# Patient Record
Sex: Female | Born: 1985 | Race: White | Hispanic: No | Marital: Married | Smoking: Never smoker
Health system: Southern US, Community
[De-identification: ages and names within clinical notes are randomized; demographics above are authoritative.]

## PROBLEM LIST (undated history)

## (undated) DIAGNOSIS — L089 Local infection of the skin and subcutaneous tissue, unspecified: Secondary | ICD-10-CM

## (undated) DIAGNOSIS — L709 Acne, unspecified: Secondary | ICD-10-CM

## (undated) HISTORY — PX: NO PAST SURGERIES: SHX2092

---

## 2016-05-14 ENCOUNTER — Other Ambulatory Visit: Payer: Self-pay | Admitting: Orthopedic Surgery

## 2016-05-17 ENCOUNTER — Encounter (HOSPITAL_COMMUNITY): Payer: Self-pay | Admitting: *Deleted

## 2016-05-17 NOTE — Progress Notes (Addendum)
Patient wears a Hijab and requested that she be allowed to wear it. I told patient we would let her keep it on as long as possible. I informed her that when she goes back to the OR we would give her a head cover and advised her that if that was not enough we could also provide a sheet that she could cover up further with but that we would be as accommodating as possible while keeping her safe and preventing infection. Patient reports having a skin rash on her face requiring ampicillin. I requested patient contact Dr. Marshell Levanumonski's office and let them know this tomorrow. Patient verbalized understanding.

## 2016-05-17 NOTE — H&P (Signed)
     PREOPERATIVE H&P  Chief Complaint: Left leg pain  HPI: Courtney Boyd is a 30 y.o. female who presents with ongoing pain in the left leg x 6 months  MRI reveals a left L5/S1 HNP with a grade 1 slip, with compression of the left S1 nerve noted  Patient has failed multiple forms of conservative care and continues to have pain (see office notes for additional details regarding the patient's full course of treatment)  No past medical history on file. No past surgical history on file. Social History   Social History  . Marital Status: N/A    Spouse Name: N/A  . Number of Children: N/A  . Years of Education: N/A   Social History Main Topics  . Smoking status: Not on file  . Smokeless tobacco: Not on file  . Alcohol Use: Not on file  . Drug Use: Not on file  . Sexual Activity: Not on file   Other Topics Concern  . Not on file   Social History Narrative  . No narrative on file   No family history on file. Allergies not on file Prior to Admission medications   Not on File     All other systems have been reviewed and were otherwise negative with the exception of those mentioned in the HPI and as above.  Physical Exam: There were no vitals filed for this visit.  General: Alert, no acute distress Cardiovascular: No pedal edema Respiratory: No cyanosis, no use of accessory musculature Skin: No lesions in the area of chief complaint Neurologic: Sensation intact distally Psychiatric: Patient is competent for consent with normal mood and affect Lymphatic: No axillary or cervical lymphadenopathy  MUSCULOSKELETAL: + SLR on the left  Assessment/Plan: Left leg pain  Plan for Procedure(s): LEFT SIDED LUMBAR 5-SACRUM 1 TRANSFORAMINAL LUMBAR INTERBODY FUSION WITH INSTRMENTATION AND ALLOGRAFT   Emilee HeroUMONSKI,Sloane Junkin LEONARD, MD 05/17/2016 8:18 AM

## 2016-05-19 ENCOUNTER — Inpatient Hospital Stay (HOSPITAL_COMMUNITY): Payer: PPO | Admitting: Anesthesiology

## 2016-05-19 ENCOUNTER — Encounter (HOSPITAL_COMMUNITY): Payer: Self-pay | Admitting: *Deleted

## 2016-05-19 ENCOUNTER — Inpatient Hospital Stay (HOSPITAL_COMMUNITY): Payer: PPO

## 2016-05-19 ENCOUNTER — Encounter (HOSPITAL_COMMUNITY)
Admission: RE | Disposition: A | Discharge status: Home / Self Care | Payer: Self-pay | Source: Ambulatory Visit | Attending: Orthopedic Surgery

## 2016-05-19 ENCOUNTER — Inpatient Hospital Stay (HOSPITAL_COMMUNITY)
Admission: RE | Admit: 2016-05-19 | Discharge: 2016-05-22 | DRG: 460 | Disposition: A | Discharge status: Home / Self Care | Payer: PPO | Source: Ambulatory Visit | Attending: Orthopedic Surgery | Admitting: Orthopedic Surgery

## 2016-05-19 DIAGNOSIS — M5417 Radiculopathy, lumbosacral region: Secondary | ICD-10-CM | POA: Diagnosis present

## 2016-05-19 DIAGNOSIS — Z419 Encounter for procedure for purposes other than remedying health state, unspecified: Secondary | ICD-10-CM

## 2016-05-19 DIAGNOSIS — M5127 Other intervertebral disc displacement, lumbosacral region: Secondary | ICD-10-CM | POA: Diagnosis present

## 2016-05-19 DIAGNOSIS — M4317 Spondylolisthesis, lumbosacral region: Principal | ICD-10-CM | POA: Diagnosis present

## 2016-05-19 DIAGNOSIS — Z01818 Encounter for other preprocedural examination: Secondary | ICD-10-CM

## 2016-05-19 DIAGNOSIS — M5410 Radiculopathy, site unspecified: Secondary | ICD-10-CM | POA: Diagnosis present

## 2016-05-19 HISTORY — DX: Acne, unspecified: L70.9

## 2016-05-19 HISTORY — DX: Local infection of the skin and subcutaneous tissue, unspecified: L08.9

## 2016-05-19 LAB — COMPREHENSIVE METABOLIC PANEL
ALT: 11 U/L — ABNORMAL LOW (ref 14–54)
ANION GAP: 6 (ref 5–15)
AST: 20 U/L (ref 15–41)
Albumin: 3.9 g/dL (ref 3.5–5.0)
Alkaline Phosphatase: 28 U/L — ABNORMAL LOW (ref 38–126)
CHLORIDE: 110 mmol/L (ref 101–111)
CO2: 23 mmol/L (ref 22–32)
Calcium: 9 mg/dL (ref 8.9–10.3)
Creatinine, Ser: 0.58 mg/dL (ref 0.44–1.00)
Glucose, Bld: 96 mg/dL (ref 65–99)
POTASSIUM: 3.7 mmol/L (ref 3.5–5.1)
Sodium: 139 mmol/L (ref 135–145)
Total Bilirubin: 0.9 mg/dL (ref 0.3–1.2)
Total Protein: 6.7 g/dL (ref 6.5–8.1)

## 2016-05-19 LAB — CBC WITH DIFFERENTIAL/PLATELET
Basophils Absolute: 0 10*3/uL (ref 0.0–0.1)
Basophils Relative: 0 %
Eosinophils Absolute: 0 10*3/uL (ref 0.0–0.7)
Eosinophils Relative: 0 %
HEMATOCRIT: 34.5 % — AB (ref 36.0–46.0)
Hemoglobin: 10.9 g/dL — ABNORMAL LOW (ref 12.0–15.0)
LYMPHS PCT: 54 %
Lymphs Abs: 1.7 10*3/uL (ref 0.7–4.0)
MCH: 22.6 pg — ABNORMAL LOW (ref 26.0–34.0)
MCHC: 31.6 g/dL (ref 30.0–36.0)
MCV: 71.6 fL — AB (ref 78.0–100.0)
MONO ABS: 0.2 10*3/uL (ref 0.1–1.0)
MONOS PCT: 7 %
NEUTROS ABS: 1.2 10*3/uL — AB (ref 1.7–7.7)
Neutrophils Relative %: 39 %
Platelets: 175 10*3/uL (ref 150–400)
RBC: 4.82 MIL/uL (ref 3.87–5.11)
RDW: 13.9 % (ref 11.5–15.5)
WBC: 3.1 10*3/uL — ABNORMAL LOW (ref 4.0–10.5)

## 2016-05-19 LAB — URINALYSIS, ROUTINE W REFLEX MICROSCOPIC
BILIRUBIN URINE: NEGATIVE
Glucose, UA: NEGATIVE mg/dL
Hgb urine dipstick: NEGATIVE
Ketones, ur: 15 mg/dL — AB
Leukocytes, UA: NEGATIVE
NITRITE: NEGATIVE
Protein, ur: NEGATIVE mg/dL
SPECIFIC GRAVITY, URINE: 1.023 (ref 1.005–1.030)
pH: 5 (ref 5.0–8.0)

## 2016-05-19 LAB — TYPE AND SCREEN
ABO/RH(D): O POS
Antibody Screen: NEGATIVE

## 2016-05-19 LAB — SURGICAL PCR SCREEN
MRSA, PCR: NEGATIVE
STAPHYLOCOCCUS AUREUS: POSITIVE — AB

## 2016-05-19 LAB — PROTIME-INR
INR: 1.17 (ref 0.00–1.49)
Prothrombin Time: 15.1 seconds (ref 11.6–15.2)

## 2016-05-19 LAB — HCG, SERUM, QUALITATIVE: Preg, Serum: NEGATIVE

## 2016-05-19 LAB — APTT: aPTT: 32 seconds (ref 24–37)

## 2016-05-19 LAB — ABO/RH: ABO/RH(D): O POS

## 2016-05-19 SURGERY — POSTERIOR LUMBAR FUSION 1 LEVEL
Anesthesia: General | Laterality: Left

## 2016-05-19 MED ORDER — BUPIVACAINE-EPINEPHRINE 0.25% -1:200000 IJ SOLN
INTRAMUSCULAR | Status: DC | PRN
Start: 2016-05-19 — End: 2016-05-19
  Administered 2016-05-19: 24 mL

## 2016-05-19 MED ORDER — PROPOFOL 1000 MG/100ML IV EMUL
INTRAVENOUS | Status: AC
  Filled 2016-05-19: qty 300

## 2016-05-19 MED ORDER — ONDANSETRON HCL 4 MG/2ML IJ SOLN: 4.0000 mg | Freq: Once | INTRAMUSCULAR | Status: DC | PRN

## 2016-05-19 MED ORDER — ACETAMINOPHEN 650 MG RE SUPP: 650.0000 mg | RECTAL | Status: DC | PRN

## 2016-05-19 MED ORDER — SUGAMMADEX SODIUM 200 MG/2ML IV SOLN
INTRAVENOUS | Status: AC
  Filled 2016-05-19: qty 2

## 2016-05-19 MED ORDER — DIAZEPAM 5 MG PO TABS
ORAL_TABLET | ORAL | Status: AC
  Filled 2016-05-19: qty 1

## 2016-05-19 MED ORDER — LACTATED RINGERS IV SOLN
INTRAVENOUS | Status: DC
  Administered 2016-05-19 (×2): via INTRAVENOUS

## 2016-05-19 MED ORDER — AMPICILLIN 500 MG PO CAPS
500.0000 mg | ORAL_CAPSULE | Freq: Two times a day (BID) | ORAL | Status: DC
  Administered 2016-05-19 – 2016-05-22 (×6): 500 mg via ORAL
  Filled 2016-05-19 (×6): qty 1

## 2016-05-19 MED ORDER — OXYCODONE-ACETAMINOPHEN 5-325 MG PO TABS
1.0000 | ORAL_TABLET | ORAL | Status: DC | PRN
  Administered 2016-05-20 – 2016-05-22 (×10): 2 via ORAL
  Filled 2016-05-19 (×10): qty 2

## 2016-05-19 MED ORDER — ZOLPIDEM TARTRATE 5 MG PO TABS: 5.0000 mg | ORAL_TABLET | Freq: Every evening | ORAL | Status: DC | PRN

## 2016-05-19 MED ORDER — ONDANSETRON HCL 4 MG/2ML IJ SOLN
INTRAMUSCULAR | Status: AC
  Filled 2016-05-19: qty 2

## 2016-05-19 MED ORDER — ALUM & MAG HYDROXIDE-SIMETH 200-200-20 MG/5ML PO SUSP: 30.0000 mL | Freq: Four times a day (QID) | ORAL | Status: DC | PRN

## 2016-05-19 MED ORDER — METHYLENE BLUE 0.5 % INJ SOLN
INTRAVENOUS | Status: AC
  Filled 2016-05-19: qty 10

## 2016-05-19 MED ORDER — OXYCODONE HCL 5 MG PO TABS
ORAL_TABLET | ORAL | Status: AC
  Filled 2016-05-19: qty 1

## 2016-05-19 MED ORDER — FENTANYL CITRATE (PF) 250 MCG/5ML IJ SOLN
INTRAMUSCULAR | Status: AC
  Filled 2016-05-19: qty 5

## 2016-05-19 MED ORDER — FLEET ENEMA 7-19 GM/118ML RE ENEM: 1.0000 | ENEMA | Freq: Once | RECTAL | Status: DC | PRN

## 2016-05-19 MED ORDER — CEFAZOLIN SODIUM-DEXTROSE 2-4 GM/100ML-% IV SOLN
2.0000 g | INTRAVENOUS | Status: AC
  Administered 2016-05-19: 2 g via INTRAVENOUS
  Filled 2016-05-19: qty 100

## 2016-05-19 MED ORDER — BISACODYL 5 MG PO TBEC: 5.0000 mg | DELAYED_RELEASE_TABLET | Freq: Every day | ORAL | Status: DC | PRN

## 2016-05-19 MED ORDER — FENTANYL CITRATE (PF) 100 MCG/2ML IJ SOLN
INTRAMUSCULAR | Status: DC | PRN
  Administered 2016-05-19: 50 ug via INTRAVENOUS
  Administered 2016-05-19 (×2): 100 ug via INTRAVENOUS
  Administered 2016-05-19: 50 ug via INTRAVENOUS
  Administered 2016-05-19: 100 ug via INTRAVENOUS
  Administered 2016-05-19 (×2): 50 ug via INTRAVENOUS

## 2016-05-19 MED ORDER — MIDAZOLAM HCL 2 MG/2ML IJ SOLN
INTRAMUSCULAR | Status: AC
  Filled 2016-05-19: qty 2

## 2016-05-19 MED ORDER — SODIUM CHLORIDE 0.9% FLUSH: 3.0000 mL | INTRAVENOUS | Status: DC | PRN

## 2016-05-19 MED ORDER — POVIDONE-IODINE 7.5 % EX SOLN
Freq: Once | CUTANEOUS | Status: DC
  Filled 2016-05-19: qty 118

## 2016-05-19 MED ORDER — PROPOFOL 10 MG/ML IV BOLUS
INTRAVENOUS | Status: AC
  Filled 2016-05-19: qty 20

## 2016-05-19 MED ORDER — PHENOL 1.4 % MT LIQD: 1.0000 | OROMUCOSAL | Status: DC | PRN

## 2016-05-19 MED ORDER — OXYCODONE HCL 5 MG PO TABS
5.0000 mg | ORAL_TABLET | Freq: Once | ORAL | Status: AC | PRN
Start: 2016-05-19 — End: 2016-05-19
  Administered 2016-05-19: 5 mg via ORAL

## 2016-05-19 MED ORDER — SODIUM CHLORIDE 0.9 % IV SOLN
INTRAVENOUS | Status: DC
  Administered 2016-05-19: 20:00:00 via INTRAVENOUS

## 2016-05-19 MED ORDER — HYDROCODONE-ACETAMINOPHEN 5-325 MG PO TABS: 1.0000 | ORAL_TABLET | ORAL | Status: DC | PRN

## 2016-05-19 MED ORDER — SUCCINYLCHOLINE CHLORIDE 20 MG/ML IJ SOLN
INTRAMUSCULAR | Status: DC | PRN
  Administered 2016-05-19: 60 mg via INTRAVENOUS

## 2016-05-19 MED ORDER — HEMOSTATIC AGENTS (NO CHARGE) OPTIME
TOPICAL | Status: DC | PRN
  Administered 2016-05-19: 1 via TOPICAL

## 2016-05-19 MED ORDER — SODIUM CHLORIDE 0.9% FLUSH
3.0000 mL | Freq: Two times a day (BID) | INTRAVENOUS | Status: DC
  Administered 2016-05-19 – 2016-05-21 (×5): 3 mL via INTRAVENOUS

## 2016-05-19 MED ORDER — OXYCODONE HCL 5 MG/5ML PO SOLN: 5.0000 mg | Freq: Once | ORAL | Status: AC | PRN

## 2016-05-19 MED ORDER — PROPOFOL 500 MG/50ML IV EMUL
INTRAVENOUS | Status: DC | PRN
  Administered 2016-05-19: 150 ug/kg/min via INTRAVENOUS
  Administered 2016-05-19: 15:00:00 via INTRAVENOUS

## 2016-05-19 MED ORDER — ONDANSETRON HCL 4 MG/2ML IJ SOLN
INTRAMUSCULAR | Status: DC | PRN
  Administered 2016-05-19 (×2): 4 mg via INTRAVENOUS

## 2016-05-19 MED ORDER — ONDANSETRON HCL 4 MG/2ML IJ SOLN
4.0000 mg | INTRAMUSCULAR | Status: DC | PRN
Start: 2016-05-19 — End: 2016-05-22

## 2016-05-19 MED ORDER — MUPIROCIN 2 % EX OINT
1.0000 "application " | TOPICAL_OINTMENT | Freq: Once | CUTANEOUS | Status: AC
  Administered 2016-05-19: 1 via TOPICAL
  Filled 2016-05-19: qty 22

## 2016-05-19 MED ORDER — CEFAZOLIN SODIUM 1-5 GM-% IV SOLN
1.0000 g | Freq: Three times a day (TID) | INTRAVENOUS | Status: AC
  Administered 2016-05-19 – 2016-05-20 (×2): 1 g via INTRAVENOUS
  Filled 2016-05-19 (×2): qty 50

## 2016-05-19 MED ORDER — DIAZEPAM 5 MG PO TABS
5.0000 mg | ORAL_TABLET | Freq: Four times a day (QID) | ORAL | Status: DC | PRN
  Administered 2016-05-19 – 2016-05-21 (×4): 5 mg via ORAL
  Filled 2016-05-19 (×3): qty 1

## 2016-05-19 MED ORDER — MIDAZOLAM HCL 5 MG/5ML IJ SOLN
INTRAMUSCULAR | Status: DC | PRN
  Administered 2016-05-19: 2 mg via INTRAVENOUS

## 2016-05-19 MED ORDER — ROCURONIUM BROMIDE 100 MG/10ML IV SOLN
INTRAVENOUS | Status: DC | PRN
  Administered 2016-05-19: 10 mg via INTRAVENOUS
  Administered 2016-05-19: 30 mg via INTRAVENOUS

## 2016-05-19 MED ORDER — 0.9 % SODIUM CHLORIDE (POUR BTL) OPTIME
TOPICAL | Status: DC | PRN
Start: 2016-05-19 — End: 2016-05-19
  Administered 2016-05-19: 1000 mL

## 2016-05-19 MED ORDER — LIDOCAINE 2% (20 MG/ML) 5 ML SYRINGE
INTRAMUSCULAR | Status: AC
  Filled 2016-05-19: qty 5

## 2016-05-19 MED ORDER — LIDOCAINE HCL (CARDIAC) 20 MG/ML IV SOLN
INTRAVENOUS | Status: DC | PRN
  Administered 2016-05-19: 60 mg via INTRAVENOUS

## 2016-05-19 MED ORDER — THROMBIN 20000 UNITS EX KIT
PACK | CUTANEOUS | Status: DC | PRN
  Administered 2016-05-19: 20000 [IU] via TOPICAL

## 2016-05-19 MED ORDER — LACTATED RINGERS IV SOLN
INTRAVENOUS | Status: DC | PRN
  Administered 2016-05-19: 14:00:00 via INTRAVENOUS

## 2016-05-19 MED ORDER — SODIUM CHLORIDE 0.9 % IV SOLN: 250.0000 mL | INTRAVENOUS | Status: DC

## 2016-05-19 MED ORDER — HYDROMORPHONE HCL 1 MG/ML IJ SOLN
INTRAMUSCULAR | Status: AC
  Filled 2016-05-19: qty 1

## 2016-05-19 MED ORDER — ACETAMINOPHEN 325 MG PO TABS
650.0000 mg | ORAL_TABLET | ORAL | Status: DC | PRN
  Administered 2016-05-21: 650 mg via ORAL
  Filled 2016-05-19: qty 2

## 2016-05-19 MED ORDER — SODIUM CHLORIDE 0.9 % IV SOLN
0.0125 ug/kg/min | INTRAVENOUS | Status: AC
  Administered 2016-05-19: .05 ug/kg/min via INTRAVENOUS
  Filled 2016-05-19: qty 2000

## 2016-05-19 MED ORDER — PROCHLORPERAZINE EDISYLATE 5 MG/ML IJ SOLN: 5.0000 mg | Freq: Once | INTRAMUSCULAR | Status: DC | PRN

## 2016-05-19 MED ORDER — DOCUSATE SODIUM 100 MG PO CAPS
100.0000 mg | ORAL_CAPSULE | Freq: Two times a day (BID) | ORAL | Status: DC
Start: 2016-05-19 — End: 2016-05-22
  Administered 2016-05-19 – 2016-05-22 (×6): 100 mg via ORAL
  Filled 2016-05-19 (×6): qty 1

## 2016-05-19 MED ORDER — SUCCINYLCHOLINE CHLORIDE 200 MG/10ML IV SOSY
PREFILLED_SYRINGE | INTRAVENOUS | Status: AC
  Filled 2016-05-19: qty 10

## 2016-05-19 MED ORDER — THROMBIN 20000 UNITS EX SOLR
CUTANEOUS | Status: AC
  Filled 2016-05-19: qty 20000

## 2016-05-19 MED ORDER — MORPHINE SULFATE (PF) 2 MG/ML IV SOLN
1.0000 mg | INTRAVENOUS | Status: DC | PRN
  Administered 2016-05-19: 2 mg via INTRAVENOUS
  Administered 2016-05-20: 4 mg via INTRAVENOUS
  Administered 2016-05-20 – 2016-05-21 (×3): 2 mg via INTRAVENOUS
  Administered 2016-05-22: 4 mg via INTRAVENOUS
  Filled 2016-05-19 (×4): qty 1
  Filled 2016-05-19 (×2): qty 2

## 2016-05-19 MED ORDER — MENTHOL 3 MG MT LOZG
1.0000 | LOZENGE | OROMUCOSAL | Status: DC | PRN
Start: 2016-05-19 — End: 2016-05-22

## 2016-05-19 MED ORDER — ALBUMIN HUMAN 5 % IV SOLN
INTRAVENOUS | Status: DC | PRN
  Administered 2016-05-19: 16:00:00 via INTRAVENOUS

## 2016-05-19 MED ORDER — PROPOFOL 10 MG/ML IV BOLUS
INTRAVENOUS | Status: DC | PRN
  Administered 2016-05-19: 200 mg via INTRAVENOUS
  Administered 2016-05-19: 40 mg via INTRAVENOUS

## 2016-05-19 MED ORDER — HYDROMORPHONE HCL 1 MG/ML IJ SOLN
0.2500 mg | INTRAMUSCULAR | Status: DC | PRN
  Administered 2016-05-19 (×2): 0.5 mg via INTRAVENOUS

## 2016-05-19 MED ORDER — SENNOSIDES-DOCUSATE SODIUM 8.6-50 MG PO TABS: 1.0000 | ORAL_TABLET | Freq: Every evening | ORAL | Status: DC | PRN

## 2016-05-19 MED ORDER — SUGAMMADEX SODIUM 200 MG/2ML IV SOLN
INTRAVENOUS | Status: DC | PRN
  Administered 2016-05-19: 120 mg via INTRAVENOUS

## 2016-05-19 SURGICAL SUPPLY — 90 items
BENZOIN TINCTURE PRP APPL 2/3 (GAUZE/BANDAGES/DRESSINGS) ×3 IMPLANT
BLADE SURG ROTATE 9660 (MISCELLANEOUS) IMPLANT
BUR PRESCISION 1.7 ELITE (BURR) ×3 IMPLANT
BUR ROUND PRECISION 4.0 (BURR) ×2 IMPLANT
BUR ROUND PRECISION 4.0MM (BURR) ×1
BUR SABER RD CUTTING 3.0 (BURR) IMPLANT
BUR SABER RD CUTTING 3.0MM (BURR)
CAGE CONCORDE BULLET 9X11X23 (Cage) ×1 IMPLANT
CAGE CONCORDE BULLET 9X11X23MM (Cage) ×1 IMPLANT
CAGE SPNL PRLL BLT NOSE 23X9 (Cage) ×1 IMPLANT
CARTRIDGE OIL MAESTRO DRILL (MISCELLANEOUS) ×1 IMPLANT
CLOSURE STERI-STRIP 1/2X4 (GAUZE/BANDAGES/DRESSINGS) ×1
CLOSURE WOUND 1/2 X4 (GAUZE/BANDAGES/DRESSINGS) ×1
CLSR STERI-STRIP ANTIMIC 1/2X4 (GAUZE/BANDAGES/DRESSINGS) ×2 IMPLANT
CONT SPEC STER OR (MISCELLANEOUS) IMPLANT
COVER MAYO STAND STRL (DRAPES) ×6 IMPLANT
COVER SURGICAL LIGHT HANDLE (MISCELLANEOUS) ×3 IMPLANT
DIFFUSER DRILL AIR PNEUMATIC (MISCELLANEOUS) ×3 IMPLANT
DRAIN CHANNEL 15F RND FF W/TCR (WOUND CARE) IMPLANT
DRAPE C-ARM 42X72 X-RAY (DRAPES) ×3 IMPLANT
DRAPE C-ARMOR (DRAPES) ×3 IMPLANT
DRAPE POUCH INSTRU U-SHP 10X18 (DRAPES) ×3 IMPLANT
DRAPE SURG 17X23 STRL (DRAPES) ×12 IMPLANT
DURAPREP 26ML APPLICATOR (WOUND CARE) ×3 IMPLANT
ELECT BLADE 4.0 EZ CLEAN MEGAD (MISCELLANEOUS) ×3
ELECT CAUTERY BLADE 6.4 (BLADE) ×3 IMPLANT
ELECT REM PT RETURN 9FT ADLT (ELECTROSURGICAL) ×3
ELECTRODE BLDE 4.0 EZ CLN MEGD (MISCELLANEOUS) ×1 IMPLANT
ELECTRODE REM PT RTRN 9FT ADLT (ELECTROSURGICAL) ×1 IMPLANT
EVACUATOR SILICONE 100CC (DRAIN) IMPLANT
FEE INTRAOP MONITOR IMPULS NCS (MISCELLANEOUS) ×1 IMPLANT
GAUZE SPONGE 4X4 12PLY STRL (GAUZE/BANDAGES/DRESSINGS) ×3 IMPLANT
GAUZE SPONGE 4X4 16PLY XRAY LF (GAUZE/BANDAGES/DRESSINGS) ×3 IMPLANT
GLOVE BIO SURGEON STRL SZ7 (GLOVE) ×3 IMPLANT
GLOVE BIO SURGEON STRL SZ8 (GLOVE) ×6 IMPLANT
GLOVE BIOGEL PI IND STRL 7.0 (GLOVE) ×1 IMPLANT
GLOVE BIOGEL PI IND STRL 8 (GLOVE) ×1 IMPLANT
GLOVE BIOGEL PI INDICATOR 7.0 (GLOVE) ×2
GLOVE BIOGEL PI INDICATOR 8 (GLOVE) ×2
GOWN STRL REUS W/ TWL LRG LVL3 (GOWN DISPOSABLE) ×2 IMPLANT
GOWN STRL REUS W/ TWL XL LVL3 (GOWN DISPOSABLE) ×1 IMPLANT
GOWN STRL REUS W/TWL LRG LVL3 (GOWN DISPOSABLE) ×4
GOWN STRL REUS W/TWL XL LVL3 (GOWN DISPOSABLE) ×2
INTRAOP MONITOR FEE IMPULS NCS (MISCELLANEOUS) ×1
INTRAOP MONITOR FEE IMPULSE (MISCELLANEOUS) ×2
IV CATH 14GX2 1/4 (CATHETERS) ×3 IMPLANT
KIT BASIN OR (CUSTOM PROCEDURE TRAY) ×3 IMPLANT
KIT POSITION SURG JACKSON T1 (MISCELLANEOUS) ×3 IMPLANT
KIT ROOM TURNOVER OR (KITS) ×3 IMPLANT
MARKER SKIN DUAL TIP RULER LAB (MISCELLANEOUS) ×3 IMPLANT
MIX DBX 10CC 35% BONE (Bone Implant) ×3 IMPLANT
NDL SAFETY ECLIPSE 18X1.5 (NEEDLE) ×1 IMPLANT
NEEDLE 22X1 1/2 (OR ONLY) (NEEDLE) IMPLANT
NEEDLE HYPO 18GX1.5 SHARP (NEEDLE) ×2
NEEDLE HYPO 25GX1X1/2 BEV (NEEDLE) ×3 IMPLANT
NEEDLE SPNL 18GX3.5 QUINCKE PK (NEEDLE) ×6 IMPLANT
NS IRRIG 1000ML POUR BTL (IV SOLUTION) ×3 IMPLANT
OIL CARTRIDGE MAESTRO DRILL (MISCELLANEOUS) ×3
PACK LAMINECTOMY ORTHO (CUSTOM PROCEDURE TRAY) ×3 IMPLANT
PACK UNIVERSAL I (CUSTOM PROCEDURE TRAY) ×3 IMPLANT
PAD ARMBOARD 7.5X6 YLW CONV (MISCELLANEOUS) ×6 IMPLANT
PATTIES SURGICAL .5 X1 (DISPOSABLE) ×3 IMPLANT
PATTIES SURGICAL .5X1.5 (GAUZE/BANDAGES/DRESSINGS) ×3 IMPLANT
PROBE PEDCLE PROBE MAGSTM DISP (MISCELLANEOUS) ×6 IMPLANT
PUTTY BONE DBX 5CC MIX (Putty) ×3 IMPLANT
ROD PRE LORDOSED 5.5X45 (Rod) ×6 IMPLANT
SCREW CORTICAL VIPER 7X35 (Screw) ×6 IMPLANT
SCREW SET SINGLE INNER (Screw) ×12 IMPLANT
SCREW VIPER CORT FIX 6.00X30 (Screw) ×6 IMPLANT
SPONGE GAUZE 4X4 12PLY STER LF (GAUZE/BANDAGES/DRESSINGS) ×3 IMPLANT
SPONGE INTESTINAL PEANUT (DISPOSABLE) ×3 IMPLANT
SPONGE SURGIFOAM ABS GEL 100 (HEMOSTASIS) ×3 IMPLANT
STRIP CLOSURE SKIN 1/2X4 (GAUZE/BANDAGES/DRESSINGS) ×2 IMPLANT
SURGIFLO W/THROMBIN 8M KIT (HEMOSTASIS) IMPLANT
SUT MNCRL AB 4-0 PS2 18 (SUTURE) ×6 IMPLANT
SUT VIC AB 0 CT1 18XCR BRD 8 (SUTURE) ×1 IMPLANT
SUT VIC AB 0 CT1 8-18 (SUTURE) ×2
SUT VIC AB 1 CT1 18XCR BRD 8 (SUTURE) ×2 IMPLANT
SUT VIC AB 1 CT1 8-18 (SUTURE) ×4
SUT VIC AB 2-0 CT2 18 VCP726D (SUTURE) ×6 IMPLANT
SYR 20CC LL (SYRINGE) ×3 IMPLANT
SYR BULB IRRIGATION 50ML (SYRINGE) ×3 IMPLANT
SYR CONTROL 10ML LL (SYRINGE) ×6 IMPLANT
SYR TB 1ML LUER SLIP (SYRINGE) ×3 IMPLANT
TAPE CLOTH SURG 6X10 WHT LF (GAUZE/BANDAGES/DRESSINGS) ×3 IMPLANT
TOWEL OR 17X24 6PK STRL BLUE (TOWEL DISPOSABLE) ×3 IMPLANT
TOWEL OR 17X26 10 PK STRL BLUE (TOWEL DISPOSABLE) ×3 IMPLANT
TRAY FOLEY CATH 16FRSI W/METER (SET/KITS/TRAYS/PACK) ×3 IMPLANT
WATER STERILE IRR 1000ML POUR (IV SOLUTION) IMPLANT
YANKAUER SUCT BULB TIP NO VENT (SUCTIONS) ×3 IMPLANT

## 2016-05-19 NOTE — Anesthesia Postprocedure Evaluation (Signed)
Anesthesia Post Note  Patient: Courtney Boyd  Procedure(s) Performed: Procedure(s) (LRB): LEFT SIDED LUMBAR 5-SACRUM 1 TRANSFORAMINAL LUMBAR INTERBODY FUSION WITH INSTRMENTATION AND ALLOGRAFT (Left)  Patient location during evaluation: PACU Anesthesia Type: General Level of consciousness: sedated, patient cooperative and oriented Pain management: pain level controlled Vital Signs Assessment: post-procedure vital signs reviewed and stable Respiratory status: spontaneous breathing, nonlabored ventilation, respiratory function stable and patient connected to nasal cannula oxygen Cardiovascular status: blood pressure returned to baseline and stable Postop Assessment: no signs of nausea or vomiting Anesthetic complications: no    Last Vitals:  Filed Vitals:   05/19/16 1810 05/19/16 1815  BP: 109/69   Pulse: 77 71  Temp:    Resp: 16 25    Last Pain:  Filed Vitals:   05/19/16 1824  PainSc: Asleep                 Moishy Laday,E. Chyrl Elwell

## 2016-05-19 NOTE — Transfer of Care (Signed)
Immediate Anesthesia Transfer of Care Note  Patient: Courtney Boyd  Procedure(s) Performed: Procedure(s) with comments: LEFT SIDED LUMBAR 5-SACRUM 1 TRANSFORAMINAL LUMBAR INTERBODY FUSION WITH INSTRMENTATION AND ALLOGRAFT (Left) - LEFT SIDED LUMBAR 5-SACRUM 1 TRANSFORAMINAL LUMBAR INTERBODY FUSION WITH INSTRMENTATION AND ALLOGRAFT  Patient Location: PACU  Anesthesia Type:General  Level of Consciousness: awake, alert  and patient cooperative  Airway & Oxygen Therapy: Patient Spontanous Breathing and Patient connected to nasal cannula oxygen  Post-op Assessment: Report given to RN and Post -op Vital signs reviewed and stable  Post vital signs: Reviewed and stable  Last Vitals:  Filed Vitals:   05/19/16 1105 05/19/16 1727  BP: 108/57   Pulse: 76   Temp: 37.1 C 36.7 C  Resp: 18     Last Pain: There were no vitals filed for this visit.    Patients Stated Pain Goal: 3 (05/19/16 1058)  Complications: No apparent anesthesia complications

## 2016-05-19 NOTE — Progress Notes (Signed)
Pt arrived to 5C19 via stretcher.  Alert and oriented.  Pt states " I can't stand the pain".  VSS. Will continue to monitor. Sondra ComeSilva, Lashawna Poche M, RN

## 2016-05-19 NOTE — Anesthesia Preprocedure Evaluation (Signed)
Anesthesia Evaluation  Patient identified by MRN, date of birth, ID band Patient awake    Reviewed: Allergy & Precautions, H&P , NPO status , Patient's Chart, lab work & pertinent test results  History of Anesthesia Complications Negative for: history of anesthetic complications  Airway Mallampati: II  TM Distance: >3 FB Neck ROM: full    Dental no notable dental hx.    Pulmonary neg pulmonary ROS,    Pulmonary exam normal breath sounds clear to auscultation       Cardiovascular negative cardio ROS Normal cardiovascular exam Rhythm:regular Rate:Normal     Neuro/Psych negative neurological ROS     GI/Hepatic negative GI ROS, Neg liver ROS,   Endo/Other  negative endocrine ROS  Renal/GU negative Renal ROS     Musculoskeletal   Abdominal   Peds  Hematology negative hematology ROS (+)   Anesthesia Other Findings   Reproductive/Obstetrics negative OB ROS                             Anesthesia Physical Anesthesia Plan  ASA: II  Anesthesia Plan: General   Post-op Pain Management:    Induction: Intravenous  Airway Management Planned: Oral ETT  Additional Equipment:   Intra-op Plan:   Post-operative Plan:   Informed Consent: I have reviewed the patients History and Physical, chart, labs and discussed the procedure including the risks, benefits and alternatives for the proposed anesthesia with the patient or authorized representative who has indicated his/her understanding and acceptance.   Dental Advisory Given  Plan Discussed with: Anesthesiologist, CRNA and Surgeon  Anesthesia Plan Comments: (Will be monitoring, plan for propofol, remi and dilaudid)        Anesthesia Quick Evaluation

## 2016-05-19 NOTE — Anesthesia Procedure Notes (Signed)
Procedure Name: Intubation Date/Time: 05/19/2016 2:08 PM Performed by: Army FossaPULLIAM, Jadda Hunsucker DANE Pre-anesthesia Checklist: Patient identified, Emergency Drugs available, Suction available, Patient being monitored and Timeout performed Patient Re-evaluated:Patient Re-evaluated prior to inductionOxygen Delivery Method: Circle system utilized Preoxygenation: Pre-oxygenation with 100% oxygen Intubation Type: IV induction Ventilation: Mask ventilation without difficulty Laryngoscope Size: Mac and 3 Grade View: Grade I Tube size: 7.0 mm Number of attempts: 1 Airway Equipment and Method: Patient positioned with wedge pillow and Stylet Placement Confirmation: ETT inserted through vocal cords under direct vision,  positive ETCO2,  CO2 detector and breath sounds checked- equal and bilateral Secured at: 22 cm Tube secured with: Tape Dental Injury: Teeth and Oropharynx as per pre-operative assessment

## 2016-05-20 DIAGNOSIS — M5127 Other intervertebral disc displacement, lumbosacral region: Secondary | ICD-10-CM | POA: Diagnosis present

## 2016-05-20 DIAGNOSIS — M4317 Spondylolisthesis, lumbosacral region: Secondary | ICD-10-CM | POA: Diagnosis present

## 2016-05-20 DIAGNOSIS — M79605 Pain in left leg: Secondary | ICD-10-CM | POA: Diagnosis present

## 2016-05-20 DIAGNOSIS — M5417 Radiculopathy, lumbosacral region: Secondary | ICD-10-CM | POA: Diagnosis present

## 2016-05-20 MED FILL — Thrombin For Soln 20000 Unit: CUTANEOUS | Qty: 1 | Status: AC

## 2016-05-20 NOTE — Progress Notes (Signed)
Occupational Therapy Evaluation Patient Details Name: Quintavia Rogstad MRN: [id number] DOB: September 16, 1986 Today's Date: 05/20/2016    History of Present Illness s/p L5/S1 decompression and fusion    Clinical Impression   Pt admitted with the above diagnoses and presents with below problem list. Pt will benefit from continued acute OT to address the below listed deficits and maximize independence with BADLs prior to d/c home with spouse assisting. PTA pt was independent with ADLs. Pt is currently min guard to min A for ADLs and functional mobility. Pt moves slow and guarded. Pain 7/10 a limiting factor. Will follow acutely.      Follow Up Recommendations  Supervision/Assistance - 24 hour;No OT follow up    Equipment Recommendations  3 in 1 bedside comode    Recommendations for Other Services PT consult     Precautions / Restrictions Precautions Precautions: Back Precaution Booklet Issued: No Precaution Comments: reviewed BAT precautions Required Braces or Orthoses: Spinal Brace Spinal Brace: Thoracolumbosacral orthotic;Applied in sitting position Restrictions Weight Bearing Restrictions: No      Mobility Bed Mobility Overal bed mobility: Needs Assistance Bed Mobility: Rolling;Sidelying to Sit;Sit to Sidelying Rolling: Min guard;Min assist Sidelying to sit: Min assist     Sit to sidelying: Min assist General bed mobility comments: min A to powerup to trunk to EOB and to maintain precautions during return to sidelying and supine  Transfers Overall transfer level: Needs assistance Equipment used: Rolling walker (2 wheeled) Transfers: Sit to/from Stand Sit to Stand: Min guard;From elevated surface         General transfer comment: elevated bed height. educated on technique with rw. min guard for safety.     Balance Overall balance assessment: Needs assistance Sitting-balance support: No upper extremity supported;Feet supported Sitting balance-Leahy Scale: Good      Standing balance support: Bilateral upper extremity supported;During functional activity Standing balance-Leahy Scale: Fair                              ADL Overall ADL's : Needs assistance/impaired Eating/Feeding: Set up;Sitting   Grooming: Set up;Sitting   Upper Body Bathing: Minimal assitance;Sitting   Lower Body Bathing: Minimal assistance;Sit to/from stand   Upper Body Dressing : Minimal assistance;Sitting   Lower Body Dressing: Minimal assistance;Sit to/from stand   Toilet Transfer: Min guard;Ambulation;RW (3n1 over toilet)   Toileting- Clothing Manipulation and Hygiene: Minimal assistance;Sit to/from stand   Tub/ Shower Transfer: Minimal assistance;Tub transfer;Ambulation;3 in 1;Rolling walker   Functional mobility during ADLs: Min guard;Rolling walker General ADL Comments: Pt completed in-room functional mobility and toilet transfer as detailed above. Education provided to spouse and pt in techniques and AE/DME for ADLs. Educated on tub shower transfer technique. Pt moves slow and guarded, min guard for safety.     Vision     Perception     Praxis      Pertinent Vitals/Pain Pain Assessment: 0-10 Pain Score: 7  Pain Location: back Pain Descriptors / Indicators: Aching;Grimacing;Operative site guarding;Sore Pain Intervention(s): Limited activity within patient's tolerance;Monitored during session;Repositioned;Utilized relaxation techniques     Hand Dominance     Extremity/Trunk Assessment Upper Extremity Assessment Upper Extremity Assessment: Overall WFL for tasks assessed   Lower Extremity Assessment Lower Extremity Assessment: Defer to PT evaluation       Communication Communication Communication: No difficulties   Cognition Arousal/Alertness: Awake/alert Behavior During Therapy: WFL for tasks assessed/performed Overall Cognitive Status: Within Functional Limits for tasks assessed  General Comments        Exercises       Shoulder Instructions      Home Living Family/patient expects to be discharged to:: Private residence Living Arrangements: Spouse/significant other Available Help at Discharge: Family;Available 24 hours/day Type of Home: Apartment Home Access: Stairs to enter Entergy CorporationEntrance Stairs-Number of Steps: 1 full flight; 2nd floor apt Entrance Stairs-Rails: Right;Left;Can reach both Home Layout: One level     Bathroom Shower/Tub: Tub/shower unit;Curtain Shower/tub characteristics: Engineer, building servicesCurtain Bathroom Toilet: Standard Bathroom Accessibility: Yes How Accessible: Accessible via walker Home Equipment: None          Prior Functioning/Environment Level of Independence: Independent             OT Diagnosis: Acute pain   OT Problem List: Impaired balance (sitting and/or standing);Decreased knowledge of use of DME or AE;Decreased knowledge of precautions;Pain   OT Treatment/Interventions: Self-care/ADL training;DME and/or AE instruction;Therapeutic activities;Patient/family education;Balance training    OT Goals(Current goals can be found in the care plan section) Acute Rehab OT Goals Patient Stated Goal: not stated OT Goal Formulation: With patient/family Time For Goal Achievement: 05/27/16 Potential to Achieve Goals: Good ADL Goals Pt Will Perform Grooming: with modified independence;standing Pt Will Perform Lower Body Bathing: with modified independence;sit to/from stand;with adaptive equipment Pt Will Perform Upper Body Dressing: with modified independence;sitting Pt Will Perform Lower Body Dressing: with modified independence;with adaptive equipment;sit to/from stand Pt Will Transfer to Toilet: with modified independence;ambulating (3n1 over toilet) Pt Will Perform Toileting - Clothing Manipulation and hygiene: with modified independence;sitting/lateral leans;sit to/from stand;with adaptive equipment Pt Will Perform Tub/Shower Transfer: Tub transfer;with  supervision;ambulating;3 in 1;rolling walker Additional ADL Goal #1: Pt will complete bed mobility at mod I level to prepare for OOB ADLs.   OT Frequency: Min 2X/week   Barriers to D/C:    full flight of stairs to reach apartment       Co-evaluation              End of Session Equipment Utilized During Treatment: Rolling walker;Back brace Nurse Communication: Patient requests pain meds  Activity Tolerance: Patient limited by pain;Patient tolerated treatment well Patient left: in bed;with call bell/phone within reach;with family/visitor present   Time: [id number] OT Time Calculation (min): 33 min Charges:  OT General Charges $OT Visit: 1 Procedure OT Evaluation $OT Eval Low Complexity: 1 Procedure OT Treatments $Self Care/Home Management : 8-22 mins G-Codes:    Pilar GrammesMathews, Shaeleigh Graw H 05/20/2016, 10:06 AM

## 2016-05-20 NOTE — Care Management Note (Signed)
Case Management Note  Patient Details  Name: Courtney Boyd DatesSafa Nest MRN: [id number] Date of Birth: [birth year]  Subjective/Objective:    Pt underwent:  LEFT SIDED LUMBAR 5-SACRUM 1 TRANSFORAMINAL LUMBAR INTERBODY FUSION WITH INSTRMENTATION AND ALLOGRAFT. She is from home with spouse.          Action/Plan: Awaiting PT rec. OT rec is for home. CM following for d/c needs.   Expected Discharge Date:                  Expected Discharge Plan:  Home/Self Care  In-House Referral:     Discharge planning Services     Post Acute Care Choice:    Choice offered to:     DME Arranged:    DME Agency:     HH Arranged:    HH Agency:     Status of Service:  In process, will continue to follow  Medicare Important Message Given:    Date Medicare IM Given:    Medicare IM give by:    Date Additional Medicare IM Given:    Additional Medicare Important Message give by:     If discussed at Long Length of Stay Meetings, dates discussed:    Additional Comments:  Kermit BaloKelli F Kyran Connaughton, RN 05/20/2016, 11:30 AM

## 2016-05-20 NOTE — Progress Notes (Signed)
    Patient doing well Left leg pain resolved Moderate expected LBP   Physical Exam: Filed Vitals:   05/20/16 0121 05/20/16 0650  BP: 106/71 98/54  Pulse: 71 83  Temp: 98.6 F (37 C) 99.2 F (37.3 C)  Resp: 16 16    Dressing in place NVI  POD #1 s/p L5/S1 decompression and fusion with resolved L leg pain  - up with PT/OT, encourage ambulation - Percocet for pain, Valium for muscle spasms - patient medically able to go home today, however, she is wishing to stay until tomorrow, which I do feel is reasonable - brace when up OOB

## 2016-05-20 NOTE — Op Note (Signed)
Courtney Boyd, Courtney Boyd                 ACCOUNT NO.:  [id number]  MEDICAL RECORD NO.:  [id number]  LOCATION:  5C19C                        FACILITY:  [facility]  PHYSICIAN:  Estill Bamberg, MD      DATE OF BIRTH:  1986-10-26  DATE OF PROCEDURE:  05/19/2016                              OPERATIVE REPORT   PREOPERATIVE DIAGNOSES: 1. Grade 1 L5-S1 spondylolisthesis. 2. Left-sided L5-S1 disk herniation compressing the left S1 nerve. 3. Left-sided S1 radiculopathy.  POSTOPERATIVE DIAGNOSES: 1. Grade 1 L5-S1 spondylolisthesis. 2. Left-sided L5-S1 disk herniation compressing the left S1 nerve. 3. Left-sided S1 radiculopathy.  PROCEDURE: 1. Left-sided L5-S1 transforaminal lumbar interbody fusion. 2. Right-sided L5-S1 posterolateral fusion. 3. Left-sided L5-S1 laminotomy with removal of herniated L5-S1     intervertebral disk herniation with subsequent decompression of the     traversing left S1 nerve. 4. Insertion of an interbody device x1 (11 x 23 mm Concorde Bullet     cage). 5. Placement of posterior instrumentation; L5, S1. 6. Use of local autograft. 7. Use of morselized allograft-DBS mix. 8. Intraoperative use of fluoroscopy.  SURGEON:  Estill Bamberg, MD.  ASSISTANTJason Coop, PA-C.  ANESTHESIA:  General endotracheal anesthesia.  COMPLICATIONS:  None.  DISPOSITION:  Stable.  ESTIMATED BLOOD LOSS:  Minimal.  INDICATIONS FOR SURGERY:  Briefly, Ms. Geralds is a very pleasant 30 year old female, who did initially present to me on January 02, 2016, with low back and left leg pain.  Her pain, at that point, had been present for 1 month.  The pain had become severe and was constant.  I did obtain an MRI, which was notable for a prominent left L5-S1 disk protrusion, and x-rays were notable for a spondylolisthesis across the L5-S1 intervertebral space.  Given the patient's ongoing pain and lack of improvement with appropriate conservative treatment, which did include a total of 3  epidural injections, she did elect to proceed with surgical intervention as outlined above.  The patient was fully aware of the risks and limitations of surgery, and did elect to proceed.  OPERATIVE DETAILS:  On May 19, 2016, the patient was brought to surgery and general endotracheal anesthesia was administered.  The patient was placed prone on a well-padded flat Jackson bed with a Wilson frame. Antibiotics were given and a time-out procedure was performed.  The back was prepped and draped.  A midline incision was then made overlying the L5-S1 intervertebral space.  The L5 and S1 laminae were identified and subperiosteally exposed.  Using anatomic landmarks in addition to AP and lateral fluoroscopy, I did cannulate the L5 pedicles bilaterally using a cortical trajectory technique.  I did tap up to a 6 mm tap bilaterally. On the right side, 6 x 30 mm screw was placed at the L5 level.  I then cannulated the S1 pedicle on the right side.  The posterolateral gutter was decorticated as were the posterior elements.  A 7 x 35 mm screw was placed through the right S1 pedicle.  A 45 mm rod was then secured into the tulip heads of the screws on the right.  Distraction was then applied across the rod and caps were placed and provisionally  tightened. On the left side, the left S1 pedicle was also cannulated and bone wax was placed in its place.  I then proceeded with a thorough and complete L5-S1 facetectomy on the left.  The traversing S1 nerve was identified and noted to be under substantial tension.  It was swollen and erythematous.  It clearly was under tension.  The nerve was gently mobilized medially.  A very large prominent intervertebral disk herniation was identified immediately ventral to it, clearly compressing the nerve.  The herniation was removed, thereby decompressing the traversing left S1 nerve.  With an assistant holding medial retraction of the left S1 nerve, I did turn my  attention towards the interbody fusion portion of the procedure.  I did perform a thorough and complete L5-S1 intervertebral diskectomy.  The endplates were prepared.  The intervertebral space was then packed with autograft from the decompression as well as allograft, as was the appropriate size intervertebral spacer.  The spacer was then tamped into position in the usual fashion.  I was very pleased with the press-fit of the spacer. The distraction was then discontinued on the contralateral right side. On the left, a 6 x 30 mm screw was placed in L5 and a 7 x 35 mm screw was placed in S1.  A 45 mm rod was then secured into the tulip heads of the screws.  Caps were placed and a final locking procedure was performed at the L5 and S1 pedicles bilaterally.  Of note, the wound was copiously irrigated intermittently throughout the procedure.  Of note, I did obtain AP and lateral fluoroscopic images throughout the procedure and was very pleased with the final fluoroscopic images.  Also of note, I did use neurologic monitoring throughout the surgery, and there was no abnormal EMG activity noted throughout the entirety of the surgery.  The wound was explored for any undue bleeding, and there was no substantial bleeding encountered.  The wound was then closed in layers using #1 Vicryl, followed by 2-0 Vicryl, followed by a 4-0 Monocryl.  Benzoin and Steri-Strips were applied, followed by sterile dressing.  All instrument counts were correct at the termination of the procedure.  Of note, Jason CoopKayla McKenzie was my assistant throughout surgery, and did aid in essential retraction, suctioning, and closure from start to finish.     Estill BambergMark Kashlynn Kundert, MD     MD/MEDQ  D:  05/19/2016  T:  05/20/2016  Job:  [id number]

## 2016-05-20 NOTE — Evaluation (Signed)
Physical Therapy Evaluation Patient Details Name: Courtney Boyd MRN: [id number] DOB: [birth year] Today's Date: 05/20/2016   History of Present Illness  s/p L5/S1 decompression and fusion   Clinical Impression  Pt moving well and follows all cueing well.  Pt ed on donning brace and performing stairs.  Feel pt is ready for D/C from PT perspective, but will f/u tomorrow to ensure no questions/concerns.      Follow Up Recommendations No PT follow up;Supervision for mobility/OOB    Equipment Recommendations  Rolling walker with 5" wheels    Recommendations for Other Services       Precautions / Restrictions Precautions Precautions: Back Precaution Booklet Issued: No Precaution Comments: reviewed BAT precautions Required Braces or Orthoses: Spinal Brace Spinal Brace: Thoracolumbosacral orthotic;Applied in sitting position Restrictions Weight Bearing Restrictions: No      Mobility  Bed Mobility Overal bed mobility: Needs Assistance Bed Mobility: Rolling;Sidelying to Sit;Sit to Sidelying Rolling: Min guard Sidelying to sit: Min assist     Sit to sidelying: Min assist General bed mobility comments: cues for log roll technique.  A for bringing trunk up to sitting and for returning LEs to bed.  Transfers Overall transfer level: Needs assistance Equipment used: Rolling walker (2 wheeled) Transfers: Sit to/from Stand Sit to Stand: Min guard         General transfer comment: Cues for UE use only.    Ambulation/Gait Ambulation/Gait assistance: Min guard Ambulation Distance (Feet): 200 Feet Assistive device: Rolling walker (2 wheeled) Gait Pattern/deviations: Step-through pattern;Decreased stride length     General Gait Details: pt moves very slowly and guardedly, but without physical A.    Stairs Stairs: Yes Stairs assistance: Min guard Stair Management: One rail Right;Step to pattern;Forwards;Sideways Number of Stairs: 11 General stair comments: cues for safe technique  both forwards and sideways.    Wheelchair Mobility    Modified Rankin (Stroke Patients Only)       Balance Overall balance assessment: Needs assistance Sitting-balance support: No upper extremity supported;Feet supported Sitting balance-Leahy Scale: Good     Standing balance support: Bilateral upper extremity supported;During functional activity Standing balance-Leahy Scale: Fair                               Pertinent Vitals/Pain Pain Assessment: 0-10 Pain Score: 6  Pain Location: Back Pain Descriptors / Indicators: Aching;Grimacing Pain Intervention(s): Limited activity within patient's tolerance;Monitored during session;Premedicated before session;Repositioned    Home Living Family/patient expects to be discharged to:: Private residence Living Arrangements: Spouse/significant other Available Help at Discharge: Family;Available 24 hours/day Type of Home: Apartment Home Access: Stairs to enter Entrance Stairs-Rails: Right;Left;Can reach both Entrance Stairs-Number of Steps: 1 full flight; 2nd floor apt Home Layout: One level Home Equipment: None      Prior Function Level of Independence: Independent               Hand Dominance        Extremity/Trunk Assessment   Upper Extremity Assessment: Defer to OT evaluation           Lower Extremity Assessment: Overall WFL for tasks assessed         Communication   Communication: No difficulties  Cognition Arousal/Alertness: Awake/alert Behavior During Therapy: WFL for tasks assessed/performed Overall Cognitive Status: Within Functional Limits for tasks assessed                      General Comments  Exercises        Assessment/Plan    PT Assessment Patient needs continued PT services  PT Diagnosis Difficulty walking;Acute pain   PT Problem List Decreased strength;Decreased activity tolerance;Decreased balance;Decreased mobility;Decreased knowledge of use of  DME;Decreased knowledge of precautions;Pain  PT Treatment Interventions DME instruction;Gait training;Stair training;Functional mobility training;Therapeutic activities;Therapeutic exercise;Balance training;Patient/family education   PT Goals (Current goals can be found in the Care Plan section) Acute Rehab PT Goals Patient Stated Goal: not stated PT Goal Formulation: With patient Time For Goal Achievement: 05/27/16 Potential to Achieve Goals: Good    Frequency Min 5X/week   Barriers to discharge        Co-evaluation               End of Session Equipment Utilized During Treatment: Gait belt Activity Tolerance: Patient tolerated treatment well Patient left: in bed;with call bell/phone within reach;with family/visitor present Nurse Communication: Mobility status         Time: 0981-19141311-1352 PT Time Calculation (min) (ACUTE ONLY): 41 min   Charges:   PT Evaluation $PT Eval Moderate Complexity: 1 Procedure PT Treatments $Gait Training: 8-22 mins $Therapeutic Activity: 8-22 mins   PT G CodesSunny Schlein:        [name], [place] [phone] 05/20/2016, 4:14 PM

## 2016-05-21 MED FILL — Sodium Chloride IV Soln 0.9%: INTRAVENOUS | Qty: 1000 | Status: AC

## 2016-05-21 NOTE — Progress Notes (Signed)
Occupational Therapy Treatment Patient Details Name: Courtney Boyd MRN: [id number] DOB: 1986-02-09 Today's Date: 05/21/2016    History of present illness s/p L5/S1 decompression and fusion    OT comments  Pt will have (A) from spouse upon d/c home. Pt able to complete tub transfer with tub bench. Educated on transfer and excellent return demo. Pt thanking therapist for (A) and education. Educated on shower and technique.   Follow Up Recommendations  No OT follow up    Equipment Recommendations  3 in 1 bedside comode;Tub/shower bench    Recommendations for Other Services      Precautions / Restrictions Precautions Precautions: Back Precaution Booklet Issued: No Required Braces or Orthoses: Spinal Brace Spinal Brace: Thoracolumbosacral orthotic;Applied in sitting position Restrictions Weight Bearing Restrictions: No       Mobility Bed Mobility Overal bed mobility: Needs Assistance Bed Mobility: Supine to Sit     Supine to sit: Min assist     General bed mobility comments: cues for sequence adn back precautions  Transfers Overall transfer level: Needs assistance Equipment used: Rolling walker (2 wheeled) Transfers: Sit to/from Stand Sit to Stand: Min guard              Balance Overall balance assessment: Needs assistance Sitting-balance support: Bilateral upper extremity supported;Feet supported Sitting balance-Leahy Scale: Good     Standing balance support: Bilateral upper extremity supported;During functional activity Standing balance-Leahy Scale: Good                     ADL Overall ADL's : Needs assistance/impaired                     Lower Body Dressing: Min guard;Bed level Lower Body Dressing Details (indicate cue type and reason): able to cross bil Le and reach feet. educated on supine position and progression to static sitting Toilet Transfer: Min guard       Tub/ Engineer, structural: International aid/development worker Details  (indicate cue type and reason): transfer bench required Functional mobility during ADLs: Min guard;Rolling walker (decr gait velocity. pt requried extended time) General ADL Comments: Pt able to transfer from bed to 5C/M gym > 200 ft. pt very slow gait velocity. Spouse present and (A) ing patient      Vision                     Perception     Praxis      Cognition   Behavior During Therapy: WFL for tasks assessed/performed Overall Cognitive Status: Within Functional Limits for tasks assessed                       Extremity/Trunk Assessment               Exercises     Shoulder Instructions       General Comments      Pertinent Vitals/ Pain       Pain Assessment: Faces Faces Pain Scale: Hurts little more Pain Location: back Pain Descriptors / Indicators: Operative site guarding Pain Intervention(s): Premedicated before session;Monitored during session  Home Living                                          Prior Functioning/Environment              Frequency Min 2X/week  Progress Toward Goals  OT Goals(current goals can now be found in the care plan section)  Progress towards OT goals: Progressing toward goals  Acute Rehab OT Goals Patient Stated Goal: not stated OT Goal Formulation: With patient/family Time For Goal Achievement: 05/27/16 Potential to Achieve Goals: Good ADL Goals Pt Will Perform Grooming: with modified independence;standing Pt Will Perform Lower Body Bathing: with modified independence;sit to/from stand;with adaptive equipment Pt Will Perform Upper Body Dressing: with modified independence;sitting Pt Will Perform Lower Body Dressing: with modified independence;with adaptive equipment;sit to/from stand Pt Will Transfer to Toilet: with modified independence;ambulating Pt Will Perform Toileting - Clothing Manipulation and hygiene: with modified independence;sitting/lateral leans;sit to/from  stand;with adaptive equipment Pt Will Perform Tub/Shower Transfer: Tub transfer;with supervision;ambulating;3 in 1;rolling walker Additional ADL Goal #1: Pt will complete bed mobility at mod I level to prepare for OOB ADLs.   Plan Discharge plan remains appropriate    Co-evaluation                 End of Session Equipment Utilized During Treatment: Gait belt;Rolling walker;Back brace   Activity Tolerance Patient tolerated treatment well   Patient Left in bed;with call bell/phone within reach;with family/visitor present   Nurse Communication Mobility status;Precautions        Time: 8295-62131149-1223 OT Time Calculation (min): 34 min  Charges: OT General Charges $OT Visit: 1 Procedure OT Treatments $Self Care/Home Management : 23-37 mins  Boone Boyd, Courtney Wisby B 05/21/2016, 2:02 PM   Courtney Boyd, Courtney Boyd   OTR/L Pager: [phone] Office: [phone] .

## 2016-05-21 NOTE — Progress Notes (Signed)
    Patient doing wonderfully, resolved left leg pain, well controlled PO LBP, has been up and walking with therapy, tolerating brace well. Eating and drinking good, NL B/B function, no N/V/D   Physical Exam: BP 107/59 mmHg  Pulse 103  Temp(Src) 102.4 F (39.1 C) (Oral)  Resp 18  Ht 5\' 6"  (1.676 m)  Wt 58.968 kg (130 lb)  BMI 20.99 kg/m2  SpO2 100%  LMP 05/01/2016  Dressing in place, TLSO brace worn appropriately  NVI  POD #2 S/P L5-S1 decompression and fusion with resolved pre-op left leg pain and minimal well controlled PO LBP  - up with PT/OT, encourage ambulation - Percocet for pain, Valium for muscle spasms - d/c home today   -written scripts signed and in paper chart  -D/C instruction printed in chart  - F/U 2 weeks in office

## 2016-05-21 NOTE — Care Management Note (Signed)
Case Management Note  Patient Details  Name: Courtney Boyd MRN: [id number] Date of Birth: [birth year]  Subjective/Objective:                    Action/Plan: Plan is for patient to discharge home today. Pt with orders for 3 in 1 and walker. CM [facility] and she will deliver the equipment to the room. Bedside RN updated.   Expected Discharge Date:                  Expected Discharge Plan:  Home/Self Care  In-House Referral:     Discharge planning Services     Post Acute Care Choice:  Durable Medical Equipment Choice offered to:     DME Arranged:  3-N-1, Walker rolling DME Agency:  [facility]  HH Arranged:    HH Agency:     Status of Service:  In process, will continue to follow  Medicare Important Message Given:    Date Medicare IM Given:    Medicare IM give by:    Date Additional Medicare IM Given:    Additional Medicare Important Message give by:     If discussed at Long Length of Stay Meetings, dates discussed:    Additional Comments:  Kermit BaloKelli F Bracy Pepper, RN 05/21/2016, 12:01 PM

## 2016-05-21 NOTE — Progress Notes (Signed)
PT Cancellation Note  Patient Details Name: Darden DatesSafa Lio MRN: [id number] DOB: 13-Oct-1986   Cancelled Treatment:    Reason Eval/Treat Not Completed: Other (comment)  Spoke with pt and spouse who indicate pt is continuing to mobilize well and that they have no further questions or concerns for PT.  Pt ready for D/C from PT perspective.     Sunny SchleinRitenour, Jahnaya Branscome F, [place] [phone] 05/21/2016, 9:20 AM

## 2016-05-22 MED ORDER — ONDANSETRON HCL 4 MG PO TABS: 4.0000 mg | ORAL_TABLET | Freq: Three times a day (TID) | ORAL | Status: AC | PRN

## 2016-05-22 MED ORDER — ONDANSETRON HCL 4 MG PO TABS: 4.0000 mg | ORAL_TABLET | Freq: Three times a day (TID) | ORAL | Status: DC | PRN

## 2016-05-22 MED ORDER — ONDANSETRON 4 MG PO TBDP
ORAL_TABLET | ORAL | Status: AC
  Administered 2016-05-22: 4 mg
  Filled 2016-05-22: qty 1

## 2016-05-22 NOTE — Progress Notes (Signed)
Not clear exactly why patient was not d/c home yesterday, but patient reports being ready today. Will d/c today as planned yesterday.

## 2016-05-22 NOTE — Progress Notes (Signed)
Physical Therapy Treatment Patient Details Name: Courtney Boyd MRN: [id number] DOB: Aug 12, 1986 Today's Date: 05/22/2016    History of Present Illness s/p L5/S1 decompression and fusion     PT Comments    Pt continues to make steady progress with mobility towards goals. Patient safe to D/C from a mobility standpoint based on progression towards goals set on PT eval.   Follow Up Recommendations  No PT follow up;Supervision for mobility/OOB     Equipment Recommendations  Rolling walker with 5" wheels    Precautions / Restrictions Precautions Precautions: Back Precaution Comments: pt able to recall back precautions, demo'd with mobility without cues needed Required Braces or Orthoses: Spinal Brace Spinal Brace: Thoracolumbosacral orthotic;Applied in sitting position    Mobility  Bed Mobility               General bed mobility comments: not observed: pt in chair before and after session  Transfers Overall transfer level: Needs assistance Equipment used: Rolling walker (2 wheeled) Transfers: Sit to/from Stand Sit to Stand: Supervision         General transfer comment: demo'd good/safe technique, increased time needed.  Ambulation/Gait Ambulation/Gait assistance: Supervision Ambulation Distance (Feet): 500 Feet Assistive device: Rolling walker (2 wheeled) Gait Pattern/deviations: Step-through pattern;Decreased stride length Gait velocity: decreased Gait velocity interpretation: Below normal speed for age/gender General Gait Details: slow, guarded gait pattern and speed. no balance issues noted   Stairs         General stair comments: deferred: pt recalled correct technique and sequence from previous instruction.      Cognition Arousal/Alertness: Awake/alert Behavior During Therapy: WFL for tasks assessed/performed Overall Cognitive Status: Within Functional Limits for tasks assessed                       Pertinent Vitals/Pain Pain Assessment:  0-10 Pain Score: 5  Pain Location: back Pain Descriptors / Indicators: Aching;Sore;Operative site guarding Pain Intervention(s): Limited activity within patient's tolerance;Monitored during session;Premedicated before session;Repositioned     PT Goals (current goals can now be found in the care plan section) Acute Rehab PT Goals Patient Stated Goal: not stated PT Goal Formulation: With patient Time For Goal Achievement: 05/27/16 Potential to Achieve Goals: Good Progress towards PT goals: Progressing toward goals    Frequency  Min 5X/week    PT Plan Current plan remains appropriate    End of Session Equipment Utilized During Treatment: Gait belt Activity Tolerance: Patient tolerated treatment well Patient left: in chair;with call bell/phone within reach     Time: 0847-0912 PT Time Calculation (min) (ACUTE ONLY): 25 min  Charges:  $Gait Training: 23-37 mins           Sallyanne KusterBury, Courtney Boyd 05/22/2016, 9:17 AM   Sallyanne KusterKathy [name], PTA, CLT Acute Rehab Services [phone] 05/22/2016, 9:18 AM

## 2016-05-22 NOTE — Care Management Note (Signed)
Case Management Note  Patient Details  Name: Courtney Boyd MRN: [id number] Date of Birth: 04-03-1986  Subjective/Objective:                  LEFT SIDED LUMBAR 5-SACRUM 1 TRANSFORAMINAL LUMBAR INTERBODY FUSION WITH INSTRMENTATION AND ALLOGRAFT (Left) Action/Plan: Discharge planning Expected Discharge Date:  05/22/16              Expected Discharge Plan:  Home/Self Care  In-House Referral:     Discharge planning Services     Post Acute Care Choice:  Durable Medical Equipment Choice offered to:     DME Arranged:  3-N-1, Walker rolling DME Agency:  [place]:  [place] Agency:     Status of Service:  Completed, signed off  Medicare Important Message Given:    Date Medicare IM Given:    Medicare IM give by:    Date Additional Medicare IM Given:    Additional Medicare Important Message give by:     If discussed at [place] of Stay Meetings, dates discussed:    Additional Comments: Cm met with pt to explain her insurance will not cover a shower stool (per [facility]).  Pt denies need for any other additional needs.  No PT/OT f/up recc or ordered.  No other Cm needs were communicated. Dellie Catholic, RN 05/22/2016, 11:43 AM

## 2016-05-22 NOTE — Progress Notes (Signed)
Patient ready for discharge to home; discharge instructions given and reviewed; Rx's given; patient discharged out via wheelchair accompanied by her spouse.

## 2016-05-22 NOTE — Discharge Instructions (Signed)
See pre-printed d/c instructions °

## 2016-06-02 ENCOUNTER — Ambulatory Visit (HOSPITAL_COMMUNITY)
Admission: RE | Admit: 2016-06-02 | Discharge: 2016-06-02 | Disposition: A | Discharge status: Home / Self Care | Payer: PPO | Source: Ambulatory Visit | Attending: Orthopedic Surgery | Admitting: Orthopedic Surgery

## 2016-06-02 ENCOUNTER — Other Ambulatory Visit (HOSPITAL_COMMUNITY): Payer: Self-pay | Admitting: Orthopedic Surgery

## 2016-06-02 DIAGNOSIS — M79605 Pain in left leg: Secondary | ICD-10-CM | POA: Diagnosis not present

## 2016-06-02 DIAGNOSIS — M79662 Pain in left lower leg: Secondary | ICD-10-CM

## 2016-06-02 DIAGNOSIS — M7989 Other specified soft tissue disorders: Secondary | ICD-10-CM | POA: Diagnosis not present

## 2016-06-02 NOTE — Progress Notes (Signed)
VASCULAR LAB PRELIMINARY  PRELIMINARY  PRELIMINARY  PRELIMINARY  Left lower extremity venous duplex has been completed.     Left:  No evidence of DVT, superficial thrombosis, or Baker's cyst.  Call with results spoke with Dr Yevette Edwardsumonski assistant@1 :26 pm  Courtney Boyd, RVT, RDMS 06/02/2016, 1:21 PM

## 2016-06-03 ENCOUNTER — Encounter (HOSPITAL_COMMUNITY): Payer: Self-pay | Admitting: Orthopedic Surgery

## 2016-06-03 NOTE — Discharge Summary (Signed)
Patient ID: Courtney DatesSafa Boyd MRN: [id number] DOB/AGE: [birth year] 30 y.o.  Admit date: 05/19/2016 Discharge date: 05/22/2016  Admission Diagnoses:  Active Problems:   Radiculopathy   Discharge Diagnoses:  Same  Past Medical History  Diagnosis Date  . Skin infection     d/t acne  . Acne     Surgeries: Procedure(s): LEFT SIDED LUMBAR 5-SACRUM 1 TRANSFORAMINAL LUMBAR INTERBODY FUSION WITH INSTRMENTATION AND ALLOGRAFT on 05/19/2016   Consultants:  None  Discharged Condition: Improved  Hospital Course: Courtney DatesSafa Se[place] is an 30 y.o. female who was admitted 05/19/2016 for operative treatment of radiculopathy. Patient has severe unremitting pain that affects sleep, daily activities, and work/hobbies. After pre-op clearance the patient was taken to the operating room on 05/19/2016 and underwent  Procedure(s): LEFT SIDED LUMBAR 5-SACRUM 1 TRANSFORAMINAL LUMBAR INTERBODY FUSION WITH INSTRMENTATION AND ALLOGRAFT.    Patient was given perioperative antibiotics:  Anti-infectives    Start     Dose/Rate Route Frequency Ordered Stop   05/19/16 2200  ampicillin (PRINCIPEN) capsule 500 mg  Status:  Discontinued     500 mg Oral 2 times daily 05/19/16 1855 05/22/16 1539   05/19/16 2100  ceFAZolin (ANCEF) IVPB 1 g/50 mL premix     1 g 100 mL/hr over 30 Minutes Intravenous Every 8 hours 05/19/16 1855 05/20/16 0542   05/19/16 1026  ceFAZolin (ANCEF) IVPB 2g/100 mL premix     2 g 200 mL/hr over 30 Minutes Intravenous On call to O.R. 05/19/16 1026 05/19/16 1328       Patient was given sequential compression devices, early ambulation to prevent DVT.  Patient benefited maximally from hospital stay and there were no complications.    Recent vital signs: BP 101/61 mmHg  Pulse 74  Temp(Src) 98.9 F (37.2 C) (Oral)  Resp 20  Ht 5\' 6"  (1.676 m)  Wt 58.968 kg (130 lb)  BMI 20.99 kg/m2  SpO2 96%  LMP 05/01/2016  Discharge Medications:     Medication List    TAKE these medications        ampicillin  500 MG capsule  Commonly known as:  PRINCIPEN  Take 500 mg by mouth 2 (two) times daily.     ondansetron 4 MG tablet  Commonly known as:  ZOFRAN  Take 1 tablet (4 mg total) by mouth every 8 (eight) hours as needed for nausea or vomiting.     tretinoin 0.025 % cream  Commonly known as:  RETIN-A  Apply 1 application topically every evening.        Diagnostic Studies: Dg Chest 2 View  05/19/2016  CLINICAL DATA:  Pre-op respiratory exam for lumbar spine surgery. EXAM: CHEST  2 VIEW COMPARISON:  None. FINDINGS: The heart size and mediastinal contours are within normal limits. Both lungs are clear. The visualized skeletal structures are unremarkable. IMPRESSION: Negative.  No active cardiopulmonary disease. Electronically Signed   By: Myles RosenthalJohn  Stahl M.D.   On: 05/19/2016 10:51   Dg Lumbar Spine 2-3 Views  05/19/2016  CLINICAL DATA:  L5-S1 laminectomy and fusion. Intraoperative imaging. EXAM: DG C-ARM 61-120 MIN; LUMBAR SPINE - 2-3 VIEW COMPARISON:  None. FINDINGS: AP and lateral fluoroscopic views of the lower lumbar spine are provided. Images demonstrate pedicle screws, stabilization bars interbody spacer in place at L5-S1. No acute abnormality is identified. IMPRESSION: L5-S1 laminectomy and fusion. Electronically Signed   By: Drusilla Kannerhomas  Dalessio M.D.   On: 05/19/2016 17:35   Dg Lumbar Spine 1 View  05/19/2016  CLINICAL DATA:  Intraoperative localization  film in patient for L5-S1 laminectomy and fusion. Initial encounter. EXAM: LUMBAR SPINE - 1 VIEW COMPARISON:  None. FINDINGS: Single lateral view of the lumbar spine is provided. Two probes are identified. The more superior is at the level of the L4 pedicles. The more inferior is directed toward the L5-S1 interspace. IMPRESSION: Localization as above. Electronically Signed   By: Drusilla Kannerhomas  Dalessio M.D.   On: 05/19/2016 15:38   Dg C-arm 1-60 Min  05/19/2016  CLINICAL DATA:  L5-S1 laminectomy and fusion. Intraoperative imaging. EXAM: DG C-ARM 61-120 MIN;  LUMBAR SPINE - 2-3 VIEW COMPARISON:  None. FINDINGS: AP and lateral fluoroscopic views of the lower lumbar spine are provided. Images demonstrate pedicle screws, stabilization bars interbody spacer in place at L5-S1. No acute abnormality is identified. IMPRESSION: L5-S1 laminectomy and fusion. Electronically Signed   By: Drusilla Kannerhomas  Dalessio M.D.   On: 05/19/2016 17:35    Disposition: 01-Home or Self Care   POD #3 S/P L5-S1 decompression and fusion with resolved pre-op left leg pain and minimal well controlled PO LBP  - up with PT/OT, encourage ambulation - Percocet for pain, Valium for muscle spasms - d/c home today  -written scripts signed and in paper chart -D/C instruction printed in chart - F/U 2 weeks in office  Signed: Georga BoraMCKENZIE, Eliska Hamil J 06/03/2016, 11:07 AM

## 2018-03-10 IMAGING — CR DG CHEST 2V
2 series · 2 of 2 positions shown · non-contrast
Comparison: None.

CLINICAL DATA: Pre-op respiratory exam for lumbar spine surgery.

EXAM:
CHEST  2 VIEW

[w chest pa]
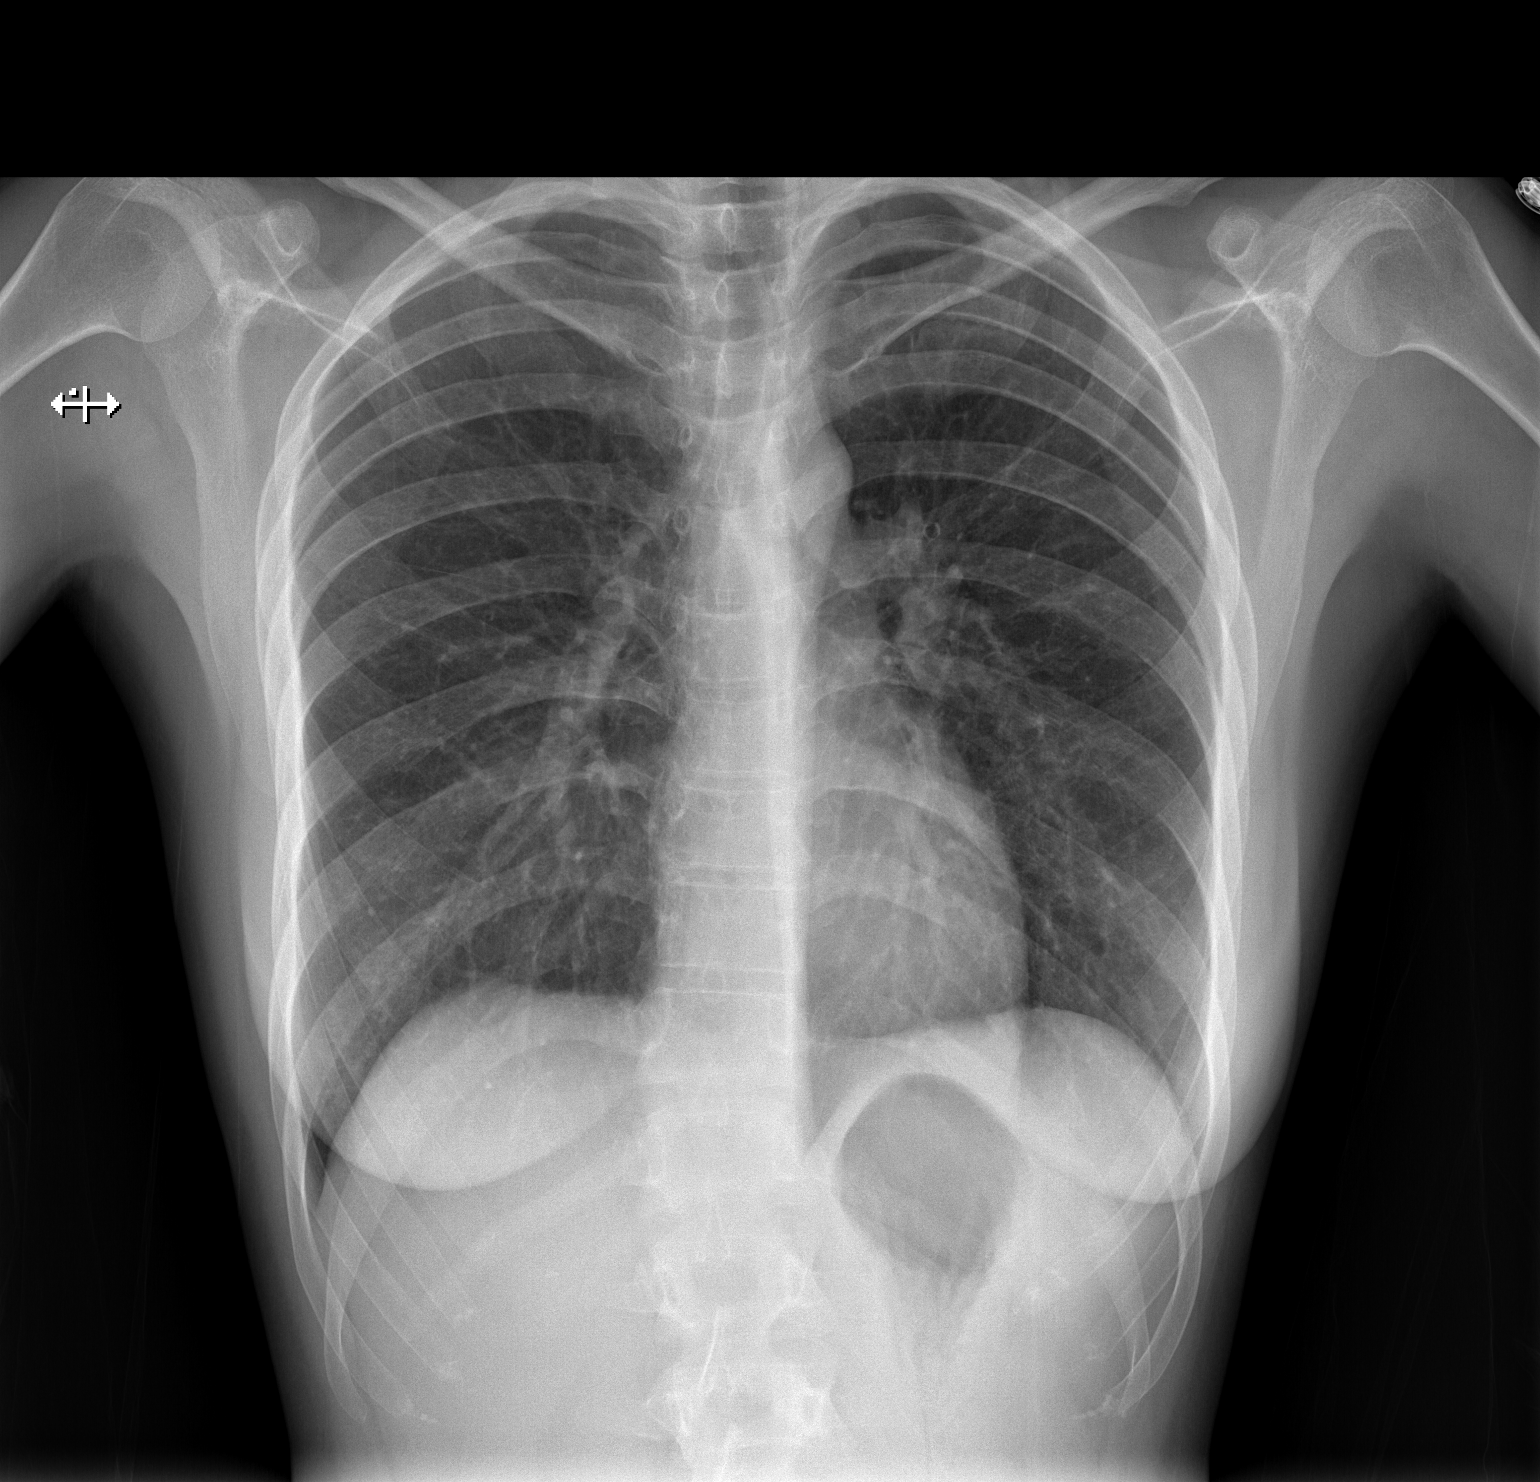

[w chest lat]
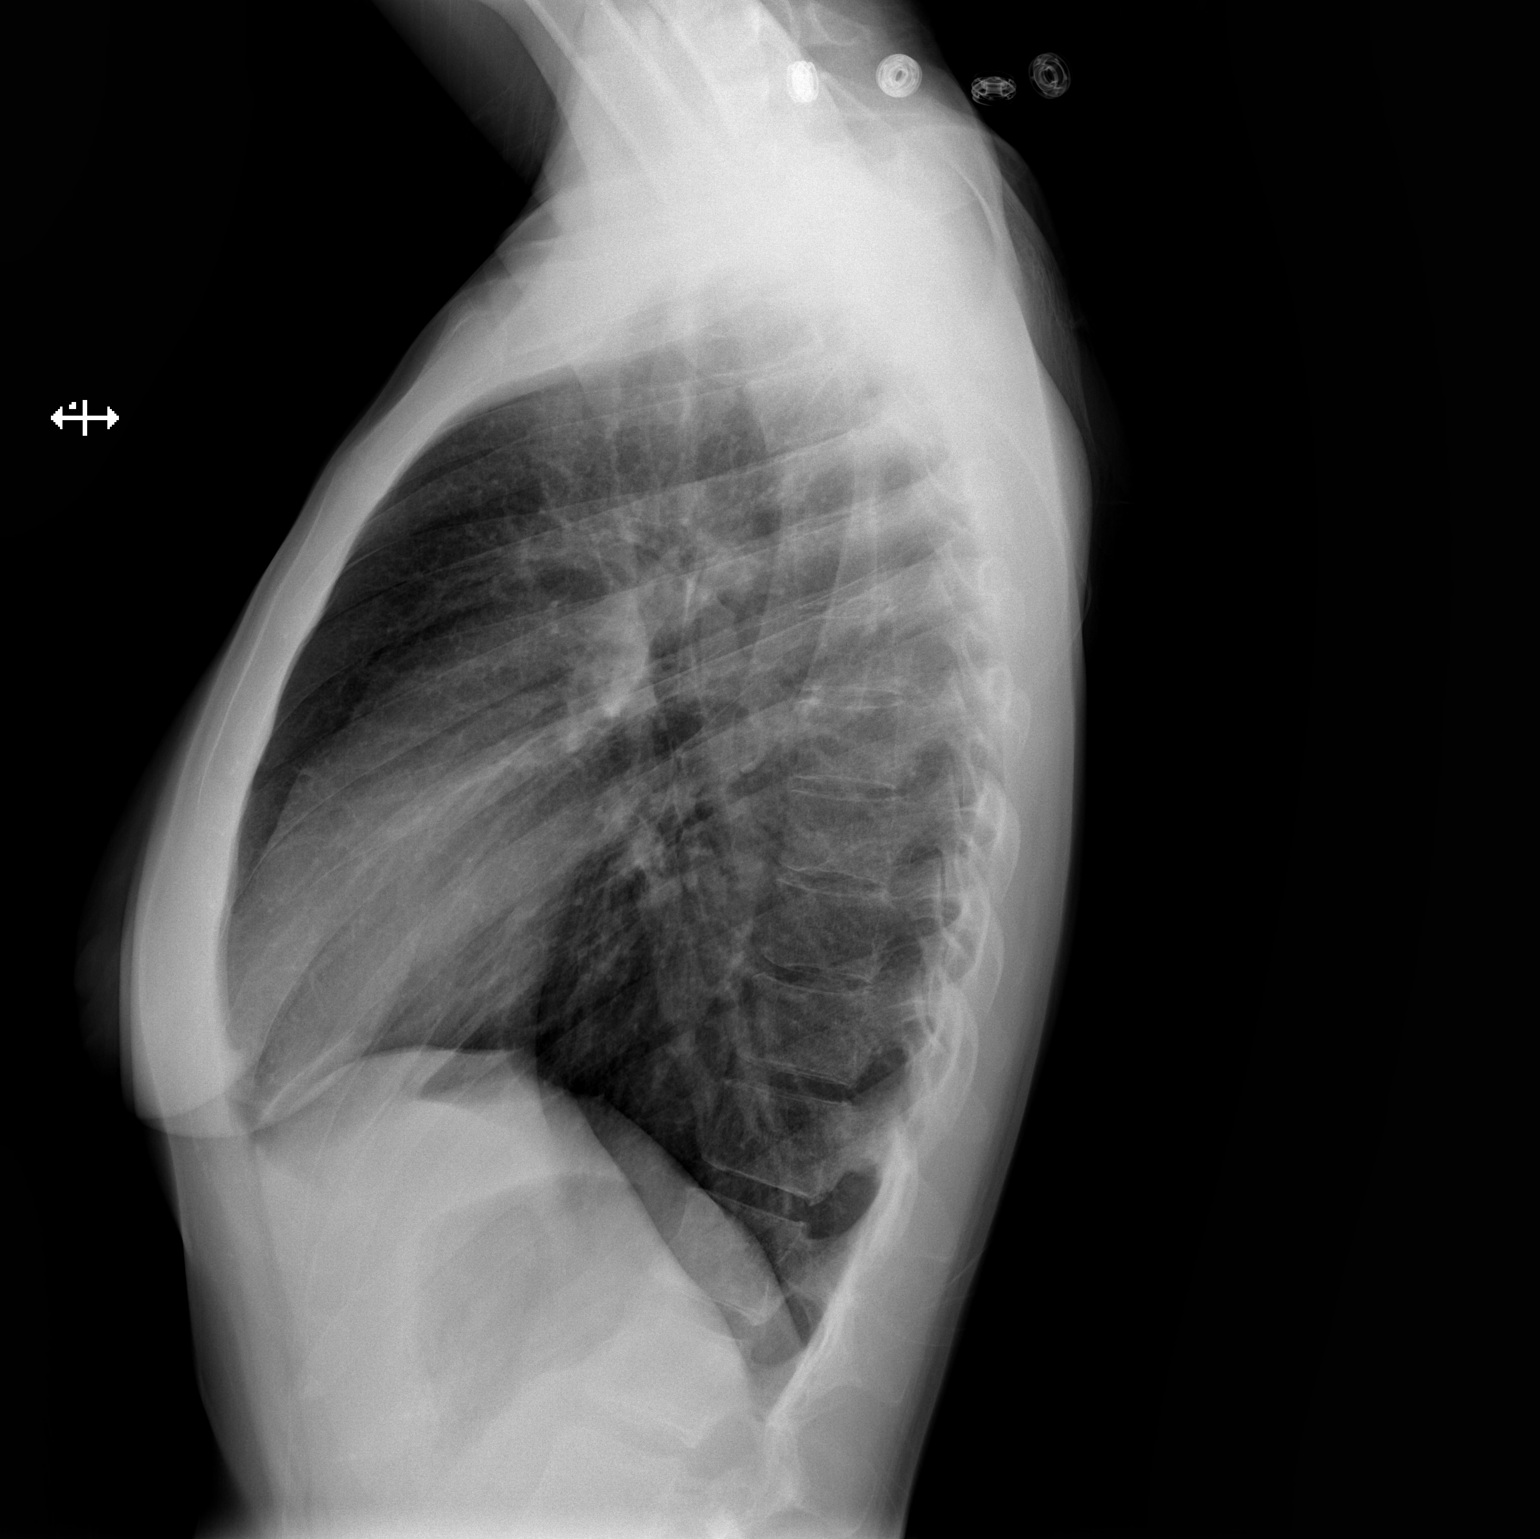

[2 of 2 positions shown; findings below may reference images not displayed]

FINDINGS: The heart size and mediastinal contours are within normal limits.
Both lungs are clear. The visualized skeletal structures are
unremarkable.
IMPRESSION: Negative.  No active cardiopulmonary disease.
